# Patient Record
Sex: Female | Born: 1945 | Race: White | Hispanic: Yes | Marital: Married | Smoking: Never smoker
Health system: Southern US, Community
[De-identification: ages and names within clinical notes are randomized; demographics above are authoritative.]

## PROBLEM LIST (undated history)

## (undated) DIAGNOSIS — I10 Essential (primary) hypertension: Secondary | ICD-10-CM

## (undated) HISTORY — PX: HERNIA REPAIR: SHX51

---

## 2001-03-27 ENCOUNTER — Encounter: Payer: Self-pay | Admitting: Emergency Medicine

## 2001-03-27 ENCOUNTER — Emergency Department (HOSPITAL_COMMUNITY): Admission: EM | Admit: 2001-03-27 | Discharge: 2001-03-27 | Payer: Self-pay

## 2005-12-10 ENCOUNTER — Emergency Department (HOSPITAL_COMMUNITY): Admission: EM | Admit: 2005-12-10 | Discharge: 2005-12-10 | Payer: Self-pay | Admitting: Emergency Medicine

## 2006-03-04 ENCOUNTER — Emergency Department (HOSPITAL_COMMUNITY): Admission: EM | Admit: 2006-03-04 | Discharge: 2006-03-04 | Payer: Self-pay | Admitting: Emergency Medicine

## 2006-06-08 ENCOUNTER — Emergency Department (HOSPITAL_COMMUNITY): Admission: EM | Admit: 2006-06-08 | Discharge: 2006-06-08 | Payer: Self-pay | Admitting: Emergency Medicine

## 2008-02-12 IMAGING — CT CT ABDOMEN W/ CM
1 of 5 series · 14 of 36 positions shown, 19 images · IV contrast (ORAL OMNI 350 & 100 ML OMNI 300)
Comparison: None.
ABDOMEN CT WITH CONTRAST:

CLINICAL DATA: Right-sided abdominal and flank pain.
TECHNIQUE: Multidetector CT imaging of the abdomen was performed following the standard protocol during bolus administration of intravenous contrast.
Contrast:  100 cc Omnipaque 300 and oral contrast.
TECHNIQUE: Multidetector CT imaging of the pelvis was performed following the standard protocol during bolus administration of intravenous contrast.

[Series 2: routine abdomen · axial · 0.77mm/px · z∈[-540,-136]mm · 14 of 91 slices shown, 19 images]
[im 5/91  soft-tissue]
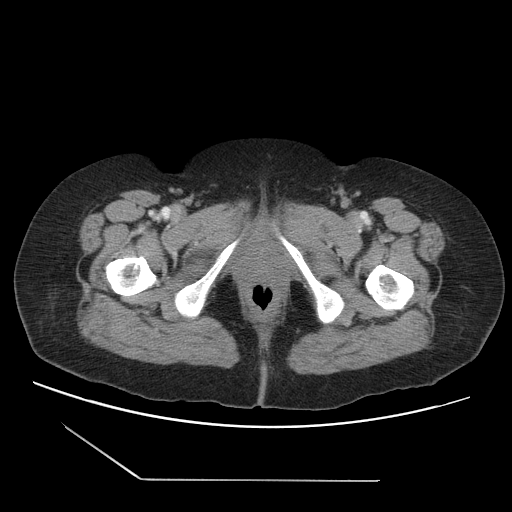
[im 5/91  bone]
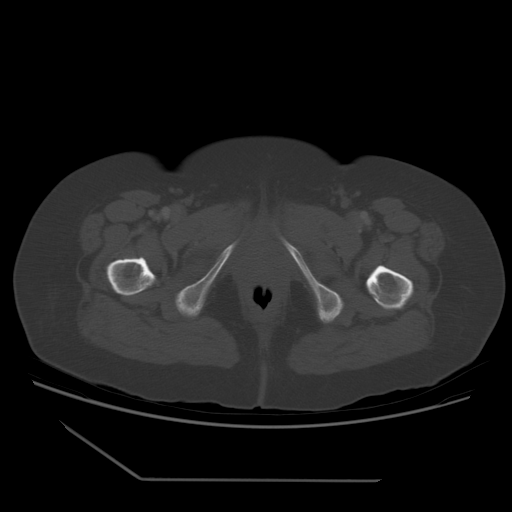
[im 15/91  soft-tissue]
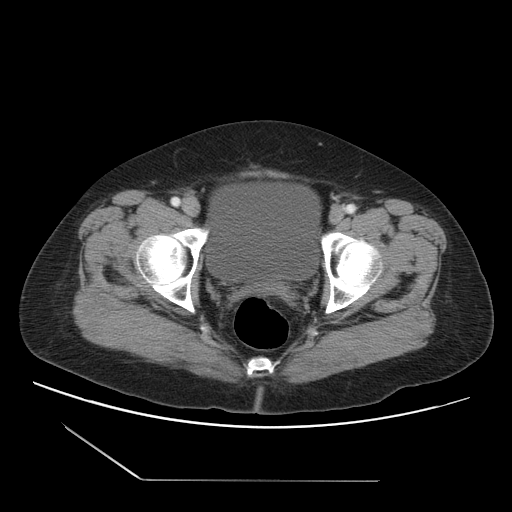
[im 19/91  soft-tissue]
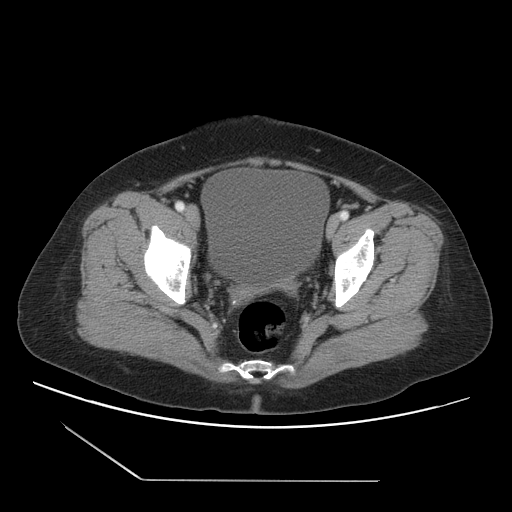
[im 24/91  soft-tissue]
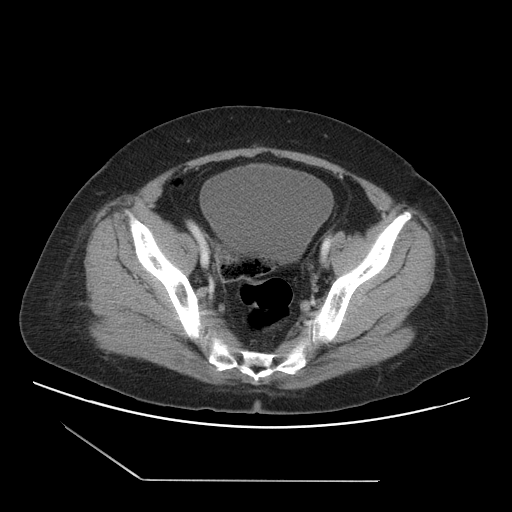
[im 34/91  soft-tissue]
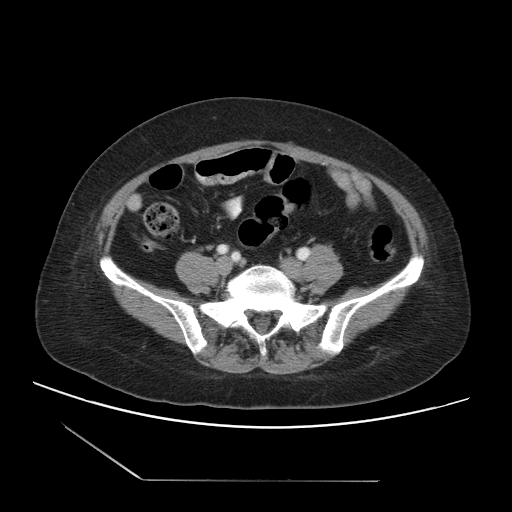
[im 38/91  soft-tissue]
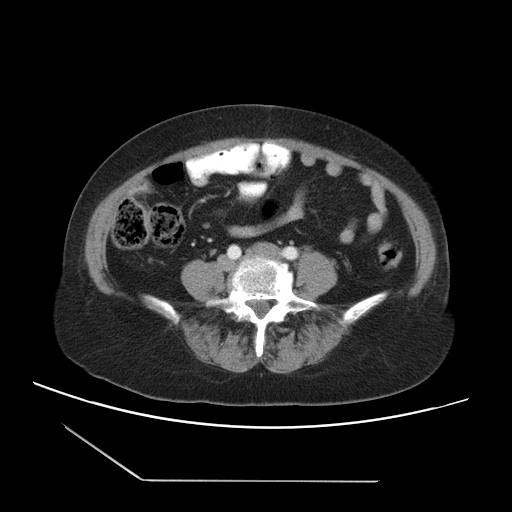
[im 48/91  soft-tissue]
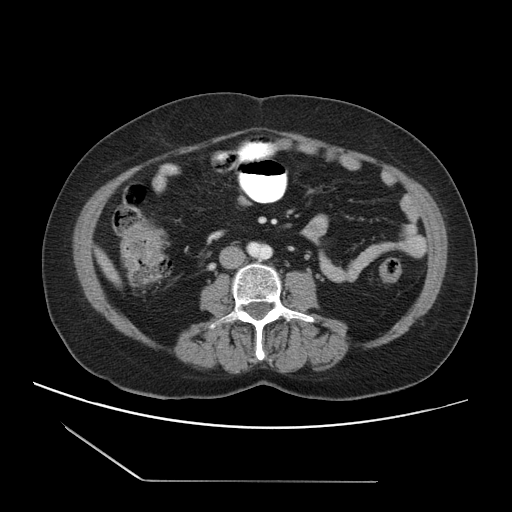
[im 53/91  soft-tissue]
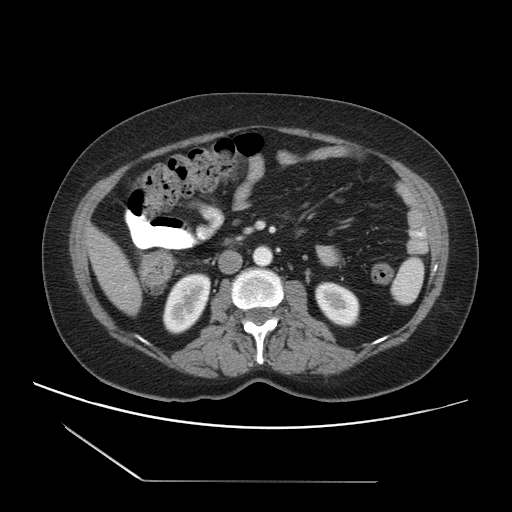
[im 57/91  soft-tissue]
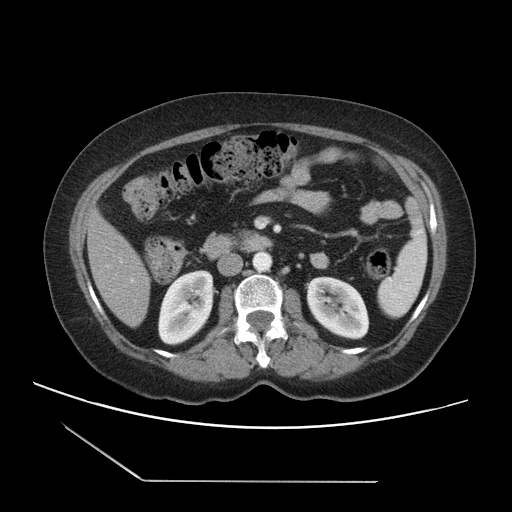
[im 57/91  bone]
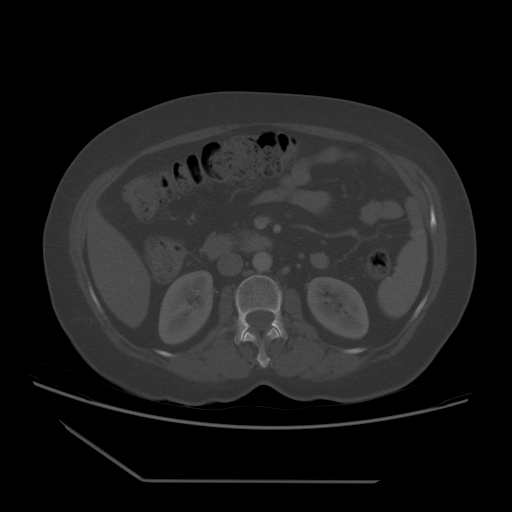
[im 67/91  soft-tissue]
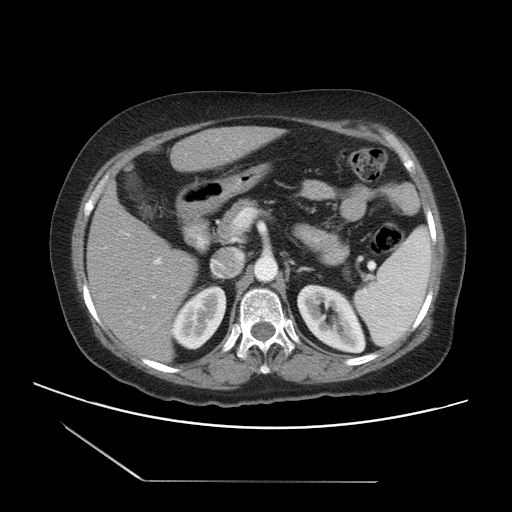
[im 72/91  soft-tissue]
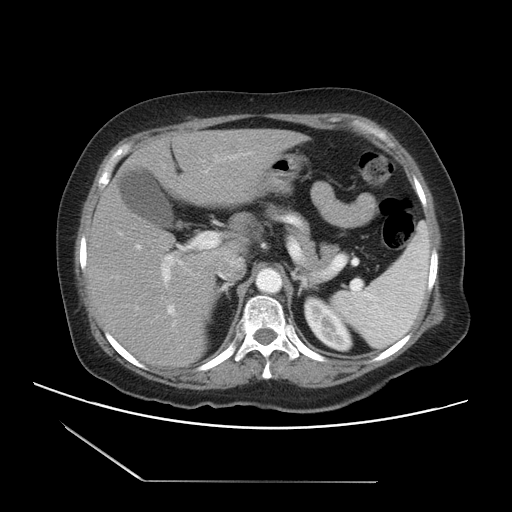
[im 72/91  lung]
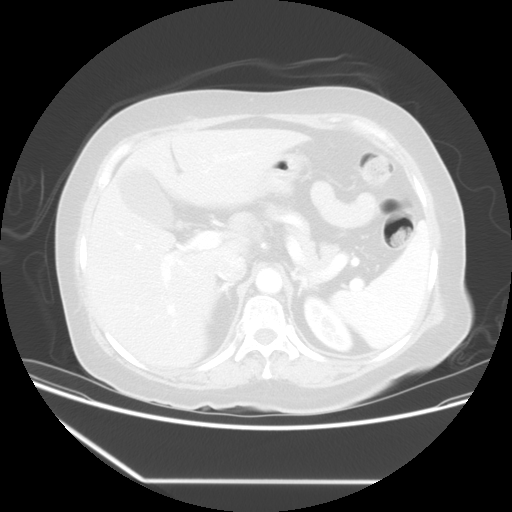
[im 76/91  soft-tissue]
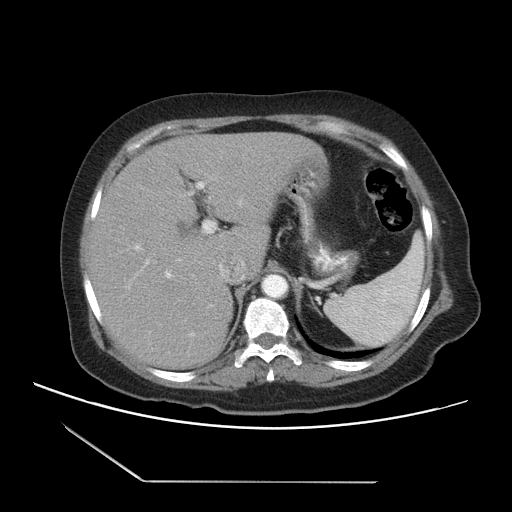
[im 76/91  lung]
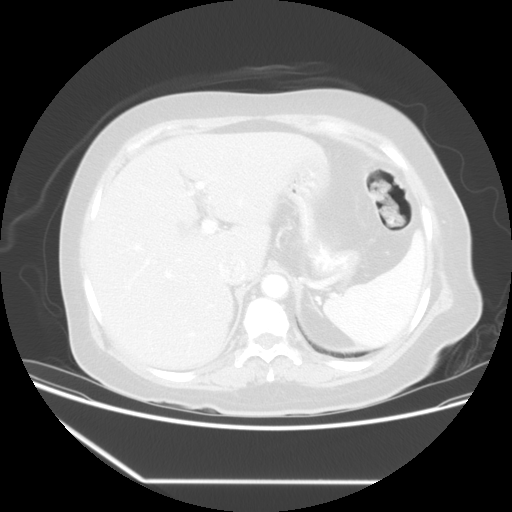
[im 81/91  lung]
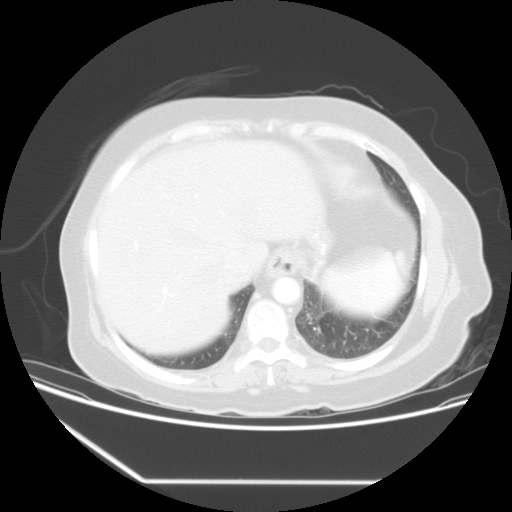
[im 86/91  soft-tissue]
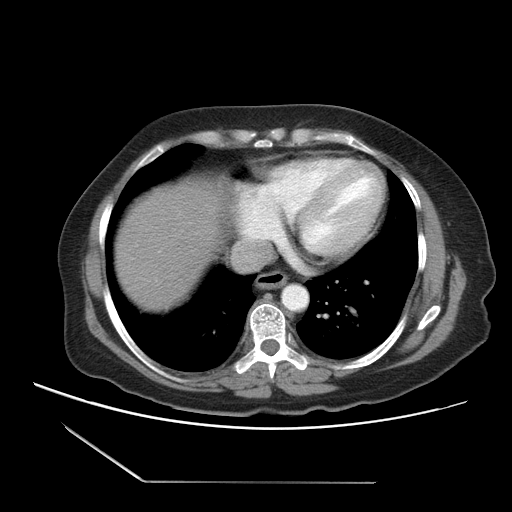
[im 86/91  lung]
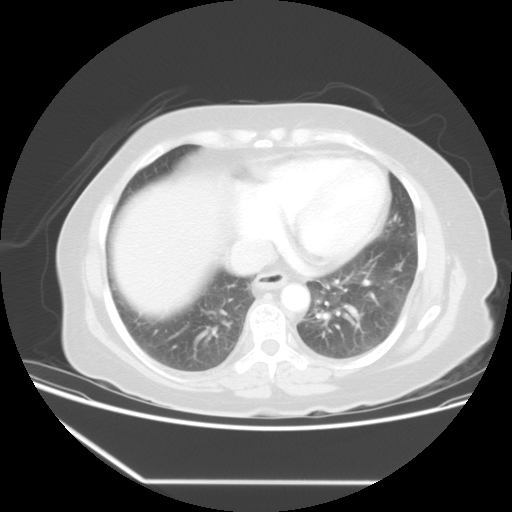

[14 of 36 positions shown; findings below may reference images not displayed]

FINDINGS: Lung bases are clear.  
A gallstone is noted dependently within the gallbladder.  Hyperdensity at the gallbladder fundus is noted, which may represent non-dependent sludge, stone, or adenomyomatosis.  A small hiatal hernia is noted.  Abdominal viscera are otherwise unremarkable.  No nephrolithiasis or ureterolithiasis is seen.  No free air or fluid.
IMPRESSION: 1.  No acute intra-abdominal pathology.
2.  Gallstones, no CT evidence for acute cholecystitis.
3.  Hyperdensity measuring 1 cm at the gallbladder fundus, differential considerations include non-dependent stone versus sludge, adenomyomatosis, or polyp.  Ultrasound is recommended as neoplasm is not excluded.
PELVIS CT WITH CONTRAST:
FINDINGS: The patient is status post hysterectomy.  Small and large bowel are unremarkable.  The appendix is normal.  No pelvic free fluid or lymphadenopathy.  No bladder calculus.  No acute osseous abnormality.
IMPRESSION: 1.  No acute intrapelvic finding.  
2.  Findings called to Dr. Weis by Dr. Jumper on 06/08/06 at [DATE] p. m.

## 2008-12-17 ENCOUNTER — Encounter (INDEPENDENT_AMBULATORY_CARE_PROVIDER_SITE_OTHER): Payer: Self-pay | Admitting: *Deleted

## 2008-12-17 ENCOUNTER — Inpatient Hospital Stay (HOSPITAL_COMMUNITY): Admission: EM | Admit: 2008-12-17 | Discharge: 2008-12-18 | Payer: Self-pay | Admitting: Emergency Medicine

## 2008-12-18 ENCOUNTER — Ambulatory Visit: Payer: Self-pay | Admitting: *Deleted

## 2008-12-18 DIAGNOSIS — E119 Type 2 diabetes mellitus without complications: Secondary | ICD-10-CM

## 2008-12-18 LAB — CONVERTED CEMR LAB
BUN: 8 mg/dL
Creatinine, Ser: 0.48 mg/dL
HDL: 38 mg/dL
LDL Cholesterol: 70 mg/dL
VLDL: 26 mg/dL

## 2008-12-24 ENCOUNTER — Ambulatory Visit: Payer: Self-pay | Admitting: Internal Medicine

## 2008-12-24 ENCOUNTER — Encounter: Payer: Self-pay | Admitting: Internal Medicine

## 2008-12-24 LAB — CONVERTED CEMR LAB: Blood Glucose, Fingerstick: 291

## 2009-01-01 ENCOUNTER — Ambulatory Visit: Payer: Self-pay | Admitting: Internal Medicine

## 2009-01-01 ENCOUNTER — Encounter: Payer: Self-pay | Admitting: Internal Medicine

## 2009-01-01 DIAGNOSIS — I10 Essential (primary) hypertension: Secondary | ICD-10-CM | POA: Insufficient documentation

## 2009-01-01 LAB — CONVERTED CEMR LAB
CO2: 25 meq/L (ref 19–32)
Calcium: 9.1 mg/dL (ref 8.4–10.5)
Creatinine, Ser: 0.58 mg/dL (ref 0.40–1.20)
Sodium: 138 meq/L (ref 135–145)

## 2009-01-07 ENCOUNTER — Ambulatory Visit: Payer: Self-pay | Admitting: Internal Medicine

## 2009-01-07 LAB — CONVERTED CEMR LAB: Blood Glucose, Home Monitor: 1 mg/dL

## 2009-01-12 ENCOUNTER — Encounter: Payer: Self-pay | Admitting: Internal Medicine

## 2009-01-14 DIAGNOSIS — K921 Melena: Secondary | ICD-10-CM

## 2009-01-20 LAB — CONVERTED CEMR LAB
OCCULT 1: NEGATIVE
OCCULT 2: NEGATIVE

## 2009-01-25 ENCOUNTER — Ambulatory Visit: Payer: Self-pay | Admitting: *Deleted

## 2009-08-11 ENCOUNTER — Telehealth: Payer: Self-pay | Admitting: *Deleted

## 2010-08-23 IMAGING — CR DG CHEST 1V PORT
1 series · 1 of 1 positions shown · non-contrast
Comparison: Chest x-ray of 12/10/2005

CLINICAL DATA: Short of breath, fatigue, diabetes

PORTABLE CHEST - 1 VIEW

[AP]
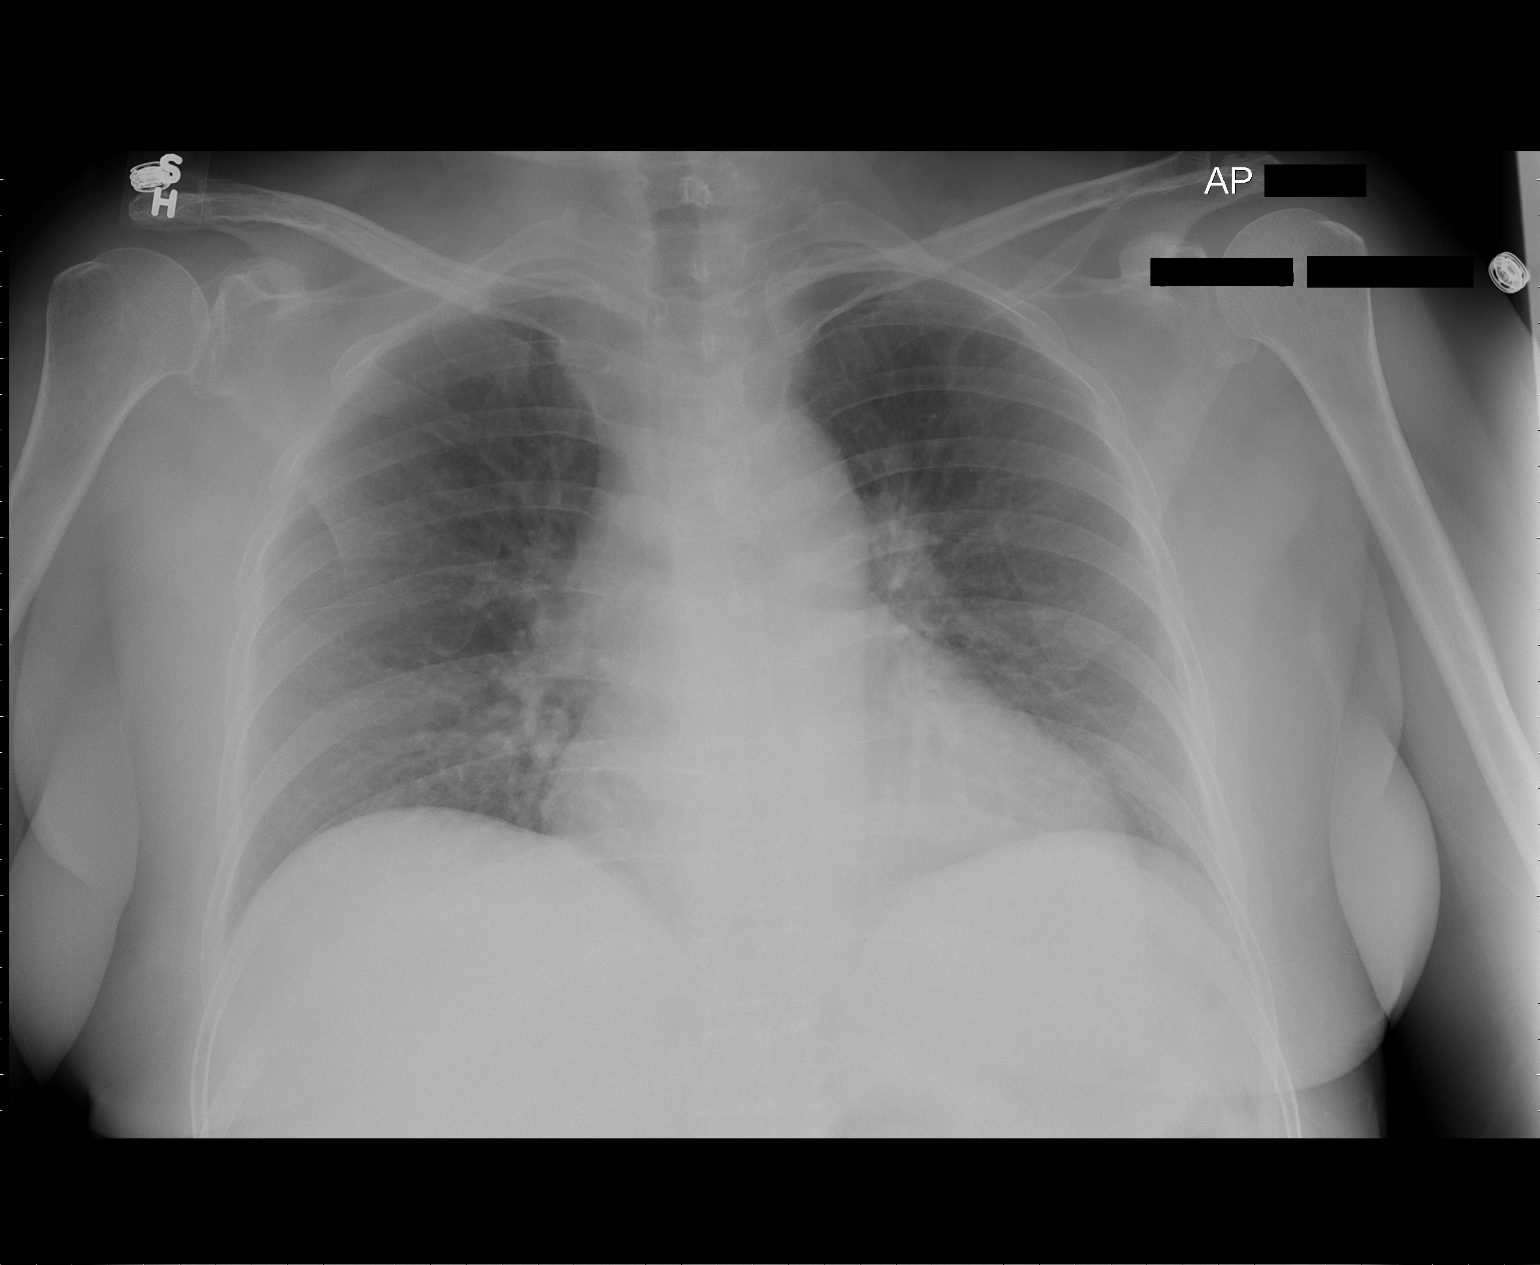

[1 of 1 positions shown; findings below may reference images not displayed]

FINDINGS: The lungs are clear.  Heart is mildly enlarged and
stable.  No bony abnormality is seen
IMPRESSION: .  Stable mild cardiomegaly.  No active lung disease.

## 2011-03-13 LAB — BASIC METABOLIC PANEL
BUN: 13 mg/dL (ref 6–23)
CO2: 25 mEq/L (ref 19–32)
Calcium: 9.3 mg/dL (ref 8.4–10.5)
Chloride: 100 mEq/L (ref 96–112)
Chloride: 108 mEq/L (ref 96–112)
Creatinine, Ser: 0.71 mg/dL (ref 0.4–1.2)
GFR calc Af Amer: >60 mL/min (ref >60)
GFR calc Af Amer: >60 mL/min (ref >60)
Sodium: 136 mEq/L (ref 135–145)

## 2011-03-13 LAB — GLUCOSE, CAPILLARY
Glucose-Capillary: 182 mg/dL — ABNORMAL HIGH (ref 70–99)
Glucose-Capillary: 194 mg/dL — ABNORMAL HIGH (ref 70–99)
Glucose-Capillary: 248 mg/dL — ABNORMAL HIGH (ref 70–99)
Glucose-Capillary: 295 mg/dL — ABNORMAL HIGH (ref 70–99)
Glucose-Capillary: 345 mg/dL — ABNORMAL HIGH (ref 70–99)
Glucose-Capillary: 360 mg/dL — ABNORMAL HIGH (ref 70–99)
Glucose-Capillary: 457 mg/dL — ABNORMAL HIGH (ref 70–99)
Glucose-Capillary: >600 mg/dL (ref 70–99)

## 2011-03-13 LAB — DIFFERENTIAL
Basophils Relative: 0 % (ref 0–1)
Eosinophils Absolute: 0.1 10*3/uL (ref 0.0–0.7)
Eosinophils Relative: 1 % (ref 0–5)
Eosinophils Relative: 2 % (ref 0–5)
Lymphocytes Relative: 40 % (ref 12–46)
Lymphs Abs: 1.6 10*3/uL (ref 0.7–4.0)
Monocytes Absolute: 0.2 10*3/uL (ref 0.1–1.0)
Monocytes Relative: 5 % (ref 3–12)
Neutrophils Relative %: 53 % (ref 43–77)

## 2011-03-13 LAB — COMPREHENSIVE METABOLIC PANEL
ALT: 28 U/L (ref 0–35)
Albumin: 3.7 g/dL (ref 3.5–5.2)
Alkaline Phosphatase: 136 U/L — ABNORMAL HIGH (ref 39–117)
Chloride: 107 mEq/L (ref 96–112)
Potassium: 3.7 mEq/L (ref 3.5–5.1)
Sodium: 137 mEq/L (ref 135–145)
Total Bilirubin: 1.1 mg/dL (ref 0.3–1.2)
Total Protein: 6.8 g/dL (ref 6.0–8.3)

## 2011-03-13 LAB — PROTIME-INR
INR: 1.1 (ref 0.00–1.49)
Prothrombin Time: 14.3 seconds (ref 11.6–15.2)

## 2011-03-13 LAB — URINALYSIS, ROUTINE W REFLEX MICROSCOPIC
Glucose, UA: >1000 mg/dL — AB
Hgb urine dipstick: NEGATIVE
Ketones, ur: 15 mg/dL — AB
Leukocytes, UA: NEGATIVE
Protein, ur: NEGATIVE mg/dL
pH: 6 (ref 5.0–8.0)

## 2011-03-13 LAB — MICROALBUMIN / CREATININE URINE RATIO: Microalb Creat Ratio: 10.9 mg/g (ref 0.0–30.0)

## 2011-03-13 LAB — CK TOTAL AND CKMB (NOT AT ARMC)
CK, MB: 0.7 ng/mL (ref 0.3–4.0)
Total CK: 58 U/L (ref 7–177)

## 2011-03-13 LAB — CBC
HCT: 35.4 % — ABNORMAL LOW (ref 36.0–46.0)
HCT: 38.2 % (ref 36.0–46.0)
Hemoglobin: 13.2 g/dL (ref 12.0–15.0)
MCHC: 34.5 g/dL (ref 30.0–36.0)
Platelets: 105 10*3/uL — ABNORMAL LOW (ref 150–400)
RBC: 3.89 MIL/uL (ref 3.87–5.11)
RBC: 4.27 MIL/uL (ref 3.87–5.11)
WBC: 4.1 10*3/uL (ref 4.0–10.5)

## 2011-03-13 LAB — LIPID PANEL
Triglycerides: 128 mg/dL (ref <150)
VLDL: 26 mg/dL (ref 0–40)

## 2011-03-13 LAB — CARDIAC PANEL(CRET KIN+CKTOT+MB+TROPI)
CK, MB: 0.5 ng/mL (ref 0.3–4.0)
Relative Index: INVALID (ref 0.0–2.5)
Total CK: 42 U/L (ref 7–177)
Total CK: 48 U/L (ref 7–177)

## 2011-03-13 LAB — URINE MICROSCOPIC-ADD ON

## 2011-03-13 LAB — BRAIN NATRIURETIC PEPTIDE: Pro B Natriuretic peptide (BNP): <30 pg/mL (ref 0.0–100.0)

## 2011-03-13 LAB — APTT: aPTT: 29 seconds (ref 24–37)

## 2011-03-13 LAB — KETONES, QUALITATIVE: Acetone, Bld: NEGATIVE

## 2011-04-11 NOTE — Discharge Summary (Signed)
NAMEADJA, RUFF NO.:  [id number]   MEDICAL RECORD NO.:  [id number]          PATIENT TYPE:  INP   LOCATION:  3738                         FACILITY:  [facility]   PHYSICIAN:  Manning Charity, MD     DATE OF BIRTH:  06-04-1946   DATE OF ADMISSION:  12/17/2008  DATE OF DISCHARGE:  12/18/2008                               DISCHARGE SUMMARY   DISCHARGE DIAGNOSES:  1. New onset diabetes mellitus with hemoglobin A1c of 9.8.  2. History of gallstones.  3. Vitiligo.  4. Status post hysterectomy.  5. Thrombocytopenia, chronic, most likely normal variant.  6. Dyspnea on exertion, resolved with negative cardiac workup.   DISCHARGE MEDICATIONS:  1. Aspirin 81 mg p.o. daily.  2. Metformin 500 mg p.o. daily for 1 week and then p.o. b.i.d.      thereafter.   DISPOSITION AND FOLLOWUP:  The patient was discharged home in stable  condition and her blood sugars were normalized.  The patient is to  follow up with Jamison Neighbor at the [facility] on  December 24, 2008 at 1:30 p.m.  At this time, the patient should be  provided a glucose meter if possible and instructed on testing of her  blood sugars and further educated on diabetic nutrition and management.  The patient was instructed to bring a record of her CBGs to the [facility] on January 01, 2009 at 2:00 p.m. to see Dr.  Comer Locket for her hospital followup visit.  The patient was discharged on  metformin 500 mg daily and instructed to titrate up by 500 mg b.i.d.  after 1 week.  During her followup visit with Dr. Comer Locket, a BMET should  be drawn and her creatinine function and potassium should be checked.  In addition, based on her blood glucose levels, consideration should be  given to addition of glipizide (starting at a low dose and titrating up  as needed).   PROCEDURES PERFORMED:  A portable chest x-ray obtained on December 17, 2008 showed stable mild cardiomegaly with no active lung  disease.   CONSULTATIONS:  None.   BRIEF ADMITTING HISTORY AND PHYSICAL:  Ms. Buffkin is a 65 year old  Hispanic female who does not speak English who has a past medical  history significant for hysterectomy and vitiligo who presents to the  emergency room complaining of polyuria, polydipsia that started  approximately 3 days prior to admission.  The patient was also  complaining of weakness and nausea for the past 2-3 days as well.  The  patient denies any chest pain, dysuria, fever, hematuria, diarrhea,  vomiting, or cough.  However, she does have a complaint of being  excessively thirsty and having a dry throat.  The patient also states  that she has noticed that she has some exertional dyspnea for the past 3  days prior to admission as well.  However, she denies any dyspnea at  rest and she denies a previous cardiac history.  She also denies any  smoking or drug abuse.  She has no prior history of diabetes and no  family history of diabetes  either.   PHYSICAL EXAMINATION:  VITAL SIGNS:  The patient had a temperature of  97.0, blood pressure 140/83, pulse of 96, respiratory rate of 18, oxygen  saturation of 98% on room air.  GENERAL:  The patient is in no acute distress.  EYES:  Pupils equal, round, and reactive to light and extraocular  motions intact.  ENT:  Oropharynx clear with no JVD or carotid bruits.  NECK:  Supple with no thyromegaly.  RESPIRATORY:  Good air movement bilaterally with no wheezes, rhonchi, or  crackles.  CARDIOVASCULAR:  Regular rate and rhythm.  Normal S1 and S2.  No  murmurs, rubs, or gallops.  The patient had 2+ peripheral pulses and no  JVD.  GI:  Positive bowel sounds with soft, nontender, and nondistended  abdomen.  EXTREMITIES:  No clubbing, cyanosis, or edema.  GENITOURINARY:  No costovertebral tenderness.  SKIN:  Diffuse vitiligo.  NEUROLOGIC:  Alert and oriented x3.  Cranial nerves II-XII were intact.  Sensation was grossly intact throughout.   Motor strength was 5/5  throughout.   LABORATORY DATA:  On admission, white blood cell count 4.9, hemoglobin  13.2, platelets 111, ANC of 89, and MCV of 3.8.  Sodium 132, potassium  4.9, chloride 100, bicarb 21, BUN 13, creatinine 0.71, glucose of 605,  and calcium 9.3.  Urinalysis showed greater than 1000 glucose with 15  ketones and 0-2 red blood cells and 0-2 white blood cells, negative  nitrites, and negative leukocytes.  PT was 14.3, INR 1.1, and PTT was  29.  Serum acetone was negative.  BNP was less than 30.  Lipase was 24.  Serum osmolality was 301.   HOSPITAL COURSE:  1. Hyperglycemia.  The patient initially presented with symptoms of      polydipsia, polyuria, and a self-reported weight loss of more than      10 pounds in the past 2 weeks.  The patient's blood glucose was      found to be over 600 in the emergency department and diabetic      ketoacidosis and hyperosmolar hyperglycemic states were first ruled      out with negative serum ketones and normal bicarbonate level and      normal serum osmolality.  The patient's blood sugars were initially      controlled with NovoLog and the patient was re-hydrated with normal      saline.  The patient was on a Glucommander drip while in the      emergency room department and then transitioned off with addition      of a sliding scale insulin throughout the rest of her hospital      course.  The patient's hemoglobin A1c was calculated to be 9.8 and      she was given a official diagnosis of adult-onset diabetes      mellitus.  CBGs continued to decrease throughout the night and the      patient was transitioned to oral hypoglycemics upon discharge,      namely metformin.  The patient was given an appropriate diabetic      education counseling while she was hospitalized and upon her      followup visit, her CBGs and diabetic medication regimen should be      titrated appropriately.  The patient was also ruled out for      secondary  causes of her hyperglycemic states including acute      coronary syndrome, infectious etiology, normal lipase, and normal  liver function tests which ruled out pancreatitis and gallbladder      etiology.  2. Dyspnea on exertion.  The patient was complaining of new onset      dyspnea on exertion which was mild and her chest x-ray revealed      stable cardiomegaly.  She denied any cough and there was no sign of      pneumonia or pneumothorax on chest x-ray.  Because she was a new      diabetic, a cardiac workup was undertaken and included negative      cardiac enzymes and a negative EKG showing normal sinus rhythm.      The patient was initially admitted to telemetry and showed no      abnormal rhythms overnight and a 2-D echocardiogram was also      performed.  The results of this were pending by day of discharge      and this will need to be followed up on at her outpatient clinic      visit.  By day of discharge, the patient was not complaining of any      dyspnea and any further symptoms should be addressed at her      followup visit.  3. Thrombocytopenia.  The patient initially presented with platelets      of 139 on admission.  Her albumin and coagulation studies were      within normal limits suggesting absence of significant hepatic      disease.  There is no history of ethanol use and the patient's      white blood cells and hemoglobin levels were also within normal      limits.  Her low normal platelet counts were considered to be a      normal variant and the patient showed no signs of overt bleeding,      so no further workup was done during this admission.  This can be      monitored on an outpatient basis during her followup visit if      needed.  4. Hypokalemia.  The patient on day of discharge was found to be      slightly hypokalemic at 3.3.  A magnesium level was checked,      however, this was in normal limits and therefore she was repleted      with oral potassium  supplementation and during her followup visit,      a BMET should be obtained to assure resolution of her hypokalemia.   DISCHARGE VITAL SIGNS:  On day of discharge, the patient had a  temperature of 98.1, systolic blood pressure 140, diastolic of 68, pulse  of 82, respiratory rate of 18, and oxygen saturation of 98% on room air.   DISCHARGE LABORATORY DATA:  White blood cell count was 4.1, hemoglobin  11.9, and platelets 105.  Sodium 136, potassium 3.3, chloride 108,  bicarb 25, BUN 8, creatinine 0.48, and glucose 166.  Total cholesterol  was 134, HDL was 38, LDL was 70, and triglycerides 128.  Hemoglobin A1c  9.8, total bilirubin was 1.1, and alk phos was 136.  AST was 17, ALT was  28, total protein was 6.8, albumin was 3.7, and calcium 8.4.   The patient had a microalbumin to creatinine ratio that was pending upon  discharge.  Should this value be greater than 30, the patient should be  considered starting ACEI therapy on an outpatient basis for renal  protection.  Furthermore, because her LDL  level was 70, it was  considered at goal for a diabetic and no statin therapy was initiated  during her hospitalization.      Lucy Antigua, MD  Electronically Signed      Manning Charity, MD  Electronically Signed    RK/MEDQ  D:  12/18/2008  T:  12/19/2008  Job:  [id number]   cc:   Jamison Neighbor, RD  Blondell Reveal, MD

## 2023-07-03 ENCOUNTER — Ambulatory Visit
Admission: EM | Admit: 2023-07-03 | Discharge: 2023-07-03 | Disposition: A | Discharge status: Home / Self Care | Payer: Self-pay | Attending: Family Medicine | Admitting: Family Medicine

## 2023-07-03 DIAGNOSIS — I10 Essential (primary) hypertension: Secondary | ICD-10-CM

## 2023-07-03 HISTORY — DX: Essential (primary) hypertension: I10

## 2023-07-03 MED ORDER — LISINOPRIL 10 MG PO TABS: 10.0000 mg | ORAL_TABLET | Freq: Every day | ORAL | 0 refills | Status: DC

## 2023-07-03 NOTE — ED Provider Notes (Signed)
[name]    CSN: [id number] Arrival date & time: 07/03/23  1033      History   Chief Complaint Chief Complaint  Patient presents with   Medication Refill    HPI Brenda Arias is a 77 y.o. female.    Medication Refill Here for htn  She has been taking lisinopril 10 mg for hypertension.  She has yet to see a primary care provider here in the [place].  Her last dose was yesterday.  She is not allergic to any medication.  She is not having any chest pain or shortness of breath or swelling.  No cough or fever    Past Medical History:  Diagnosis Date   Hypertension     Patient Active Problem List   Diagnosis Date Noted   HEMOCCULT POSITIVE STOOL 01/14/2009   HYPERTENSION, ESSENTIAL 01/01/2009   Type II or unspecified type diabetes mellitus without mention of complication, not stated as uncontrolled 12/18/2008    Past Surgical History:  Procedure Laterality Date   HERNIA REPAIR      OB History   No obstetric history on file.      Home Medications    Prior to Admission medications   Medication Sig Start Date End Date Taking? Authorizing Provider  lisinopril (ZESTRIL) 10 MG tablet Take 1 tablet (10 mg total) by mouth daily. 07/03/23   Brenda Resides, MD    Family History History reviewed. No pertinent family history.  Social History Social History   Tobacco Use   Smoking status: Never   Smokeless tobacco: Never  Vaping Use   Vaping status: Never Used  Substance Use Topics   Alcohol use: Never   Drug use: Never     Allergies   Patient has no allergy information on record.   Review of Systems Review of Systems   Physical Exam Triage Vital Signs ED Triage Vitals  Encounter Vitals Group     BP 07/03/23 1130 133/69     Systolic BP Percentile --      Diastolic BP Percentile --      Pulse Rate 07/03/23 1130 68     Resp 07/03/23 1130 16     Temp 07/03/23 1130 98.5 F (36.9 C)     Temp Source 07/03/23  1130 Oral     SpO2 07/03/23 1130 97 %     Weight --      Height --      Head Circumference --      Peak Flow --      Pain Score 07/03/23 1129 0     Pain Loc --      Pain Education --      Exclude from Growth Chart --    No data found.  Updated Vital Signs BP 133/69 (BP Location: Left Arm)   Pulse 68   Temp 98.5 F (36.9 C) (Oral)   Resp 16   SpO2 97%   Visual Acuity Right Eye Distance:   Left Eye Distance:   Bilateral Distance:    Right Eye Near:   Left Eye Near:    Bilateral Near:     Physical Exam Vitals reviewed.  Constitutional:      General: She is not in acute distress.    Appearance: She is not toxic-appearing.  Cardiovascular:     Rate and Rhythm: Normal rate and regular rhythm.     Heart sounds: No murmur heard. Pulmonary:     Breath sounds: Normal breath sounds.  Musculoskeletal:     Right lower leg: No edema.     Left lower leg: No edema.  Neurological:     General: No focal deficit present.     Mental Status: She is alert and oriented to person, place, and time.  Psychiatric:        Behavior: Behavior normal.      UC Treatments / Results  Labs (all labs ordered are listed, but only abnormal results are displayed) Labs Reviewed - No data to display  EKG   Radiology No results found.  Procedures Procedures (including critical care time)  Medications Ordered in UC Medications - No data to display  Initial Impression / Assessment and Plan / UC Course  I have reviewed the triage vital signs and the nursing notes.  Pertinent labs & imaging results that were available during my care of the patient were reviewed by me and considered in my medical decision making (see chart for details).        Lisinopril is filled for 90 days.  She has an appointment set up already with primary care for the middle of October. Final Clinical Impressions(s) / UC Diagnoses   Final diagnoses:  Essential hypertension, benign     Discharge  Instructions      Continue taking lisinopril 10 mg 1 tab daily for blood pressure. (Contine tomando lisinopril 10 mg 1 pastilla al da para la presin arterial.)  I am glad you have an appoint to establish care with primary care in October. (Me alegra que tenga una cita para establecer la atencin en atencin primaria en octubre).    ED Prescriptions     Medication Sig Dispense Auth. Provider   lisinopril (ZESTRIL) 10 MG tablet Take 1 tablet (10 mg total) by mouth daily. 90 tablet Taliesin Hartlage, Janace Aris, MD      PDMP not reviewed this encounter.   Brenda Resides, MD 07/03/23 1155

## 2023-07-03 NOTE — ED Triage Notes (Signed)
Patient here today for a medication refill. She takes Lisinopril 10 mg. Her last pill was yesterday.

## 2023-07-03 NOTE — Discharge Instructions (Addendum)
Continue taking lisinopril 10 mg 1 tab daily for blood pressure. (Contine tomando lisinopril 10 mg 1 pastilla al da para la presin arterial.)  I am glad you have an appoint to establish care with primary care in October. (Me alegra que tenga una cita para establecer la atencin en atencin primaria en octubre).

## 2023-09-11 ENCOUNTER — Encounter: Payer: Self-pay | Admitting: Family

## 2023-09-11 NOTE — Progress Notes (Signed)
Erroneous encounter-disregard

## 2023-09-12 ENCOUNTER — Ambulatory Visit
Admission: EM | Admit: 2023-09-12 | Discharge: 2023-09-12 | Disposition: A | Discharge status: Home / Self Care | Payer: Self-pay | Attending: Physician Assistant | Admitting: Physician Assistant

## 2023-09-12 ENCOUNTER — Encounter: Payer: Self-pay | Admitting: *Deleted

## 2023-09-12 ENCOUNTER — Other Ambulatory Visit: Payer: Self-pay

## 2023-09-12 DIAGNOSIS — I10 Essential (primary) hypertension: Secondary | ICD-10-CM

## 2023-09-12 MED ORDER — LISINOPRIL 10 MG PO TABS: 10.0000 mg | ORAL_TABLET | Freq: Every day | ORAL | 0 refills | Status: AC

## 2023-09-12 NOTE — ED Triage Notes (Signed)
Pt needs refill of BP med. Request 6 month refill as she is "low resources"

## 2023-09-12 NOTE — ED Provider Notes (Signed)
[name]    CSN: [id number] Arrival date & time: 09/12/23  0808      History   Chief Complaint Chief Complaint  Patient presents with   Hypertension    HPI Brenda Arias is a 77 y.o. female.   Patient here today for follow-up of blood pressure.  She reports that she needs refill lisinopril-she has been doing well with same.  She denies any side effects and has not had any shortness of breath, chest pain, swelling, headaches, numbness or weakness.  She notes she was supposed to have appointment with PCP yesterday to establish care but was unable to make appointment.  The history is provided by the patient. The history is limited by a language barrier. A language interpreter was used.    Past Medical History:  Diagnosis Date   Hypertension     Patient Active Problem List   Diagnosis Date Noted   HEMOCCULT POSITIVE STOOL 01/14/2009   Essential hypertension 01/01/2009   Type II or unspecified type diabetes mellitus without mention of complication, not stated as uncontrolled 12/18/2008    Past Surgical History:  Procedure Laterality Date   HERNIA REPAIR      OB History   No obstetric history on file.      Home Medications    Prior to Admission medications   Medication Sig Start Date End Date Taking? Authorizing Provider  lisinopril (ZESTRIL) 10 MG tablet Take 1 tablet (10 mg total) by mouth daily. 09/12/23   Tomi Bamberger, PA-C    Family History History reviewed. No pertinent family history.  Social History Social History   Tobacco Use   Smoking status: Never   Smokeless tobacco: Never  Vaping Use   Vaping status: Never Used  Substance Use Topics   Alcohol use: Never   Drug use: Never     Allergies   Patient has no allergy information on record.   Review of Systems Review of Systems  Constitutional:  Negative for chills and fever.  Eyes:  Negative for discharge and redness.  Respiratory:  Negative for shortness  of breath.   Cardiovascular:  Negative for chest pain.  Gastrointestinal:  Negative for abdominal pain, nausea and vomiting.  Neurological:  Negative for numbness and headaches.     Physical Exam Triage Vital Signs ED Triage Vitals  Encounter Vitals Group     BP      Systolic BP Percentile      Diastolic BP Percentile      Pulse      Resp      Temp      Temp src      SpO2      Weight      Height      Head Circumference      Peak Flow      Pain Score      Pain Loc      Pain Education      Exclude from Growth Chart    No data found.  Updated Vital Signs BP 117/76 (BP Location: Left Arm)   Pulse 71   Temp 98.2 F (36.8 C) (Oral)   Resp 16   SpO2 97%      Physical Exam Vitals and nursing note reviewed.  Constitutional:      General: She is not in acute distress.    Appearance: Normal appearance. She is not ill-appearing.  HENT:     Head: Normocephalic and atraumatic.  Eyes:  Conjunctiva/sclera: Conjunctivae normal.  Cardiovascular:     Rate and Rhythm: Normal rate and regular rhythm.  Pulmonary:     Effort: Pulmonary effort is normal. No respiratory distress.     Breath sounds: No wheezing, rhonchi or rales.  Musculoskeletal:     Right lower leg: No edema.     Left lower leg: No edema.  Neurological:     Mental Status: She is alert.  Psychiatric:        Mood and Affect: Mood normal.        Behavior: Behavior normal.        Thought Content: Thought content normal.      UC Treatments / Results  Labs (all labs ordered are listed, but only abnormal results are displayed) Labs Reviewed - No data to display  EKG   Radiology No results found.  Procedures Procedures (including critical care time)  Medications Ordered in UC Medications - No data to display  Initial Impression / Assessment and Plan / UC Course  I have reviewed the triage vital signs and the nursing notes.  Pertinent labs & imaging results that were available during my care of  the patient were reviewed by me and considered in my medical decision making (see chart for details).    Lisinopril refilled as requested.  Recommended she establish care with PCP and patient walked over to PCP office next-door to reschedule missed appointment.  Encouraged sooner follow-up with any further concerns.  Final Clinical Impressions(s) / UC Diagnoses   Final diagnoses:  Essential hypertension   Discharge Instructions   None    ED Prescriptions     Medication Sig Dispense Auth. Provider   lisinopril (ZESTRIL) 10 MG tablet Take 1 tablet (10 mg total) by mouth daily. 90 tablet Tomi Bamberger, PA-C      PDMP not reviewed this encounter.   Tomi Bamberger, PA-C 09/12/23 [phone]

## 2023-11-26 ENCOUNTER — Encounter: Payer: Self-pay | Admitting: Family

## 2023-11-26 NOTE — Progress Notes (Signed)
Erroneous encounter-disregard
